# Patient Record
Sex: Female | Born: 1945 | Race: White | Hispanic: No | Marital: Married | Smoking: Never smoker
Health system: Southern US, Community
[De-identification: ages and names within clinical notes are randomized; demographics above are authoritative.]

## PROBLEM LIST (undated history)

## (undated) DIAGNOSIS — M722 Plantar fascial fibromatosis: Secondary | ICD-10-CM

## (undated) HISTORY — DX: Plantar fascial fibromatosis: M72.2

---

## 2012-02-16 ENCOUNTER — Other Ambulatory Visit: Payer: Self-pay | Admitting: Family Medicine

## 2012-02-16 DIAGNOSIS — Z780 Asymptomatic menopausal state: Secondary | ICD-10-CM

## 2012-02-16 DIAGNOSIS — Z1231 Encounter for screening mammogram for malignant neoplasm of breast: Secondary | ICD-10-CM

## 2012-03-25 ENCOUNTER — Ambulatory Visit
Admission: RE | Admit: 2012-03-25 | Discharge: 2012-03-25 | Disposition: A | Discharge status: Home / Self Care | Payer: Medicare Other | Source: Ambulatory Visit | Attending: Family Medicine | Admitting: Family Medicine

## 2012-03-25 DIAGNOSIS — Z780 Asymptomatic menopausal state: Secondary | ICD-10-CM

## 2012-03-25 DIAGNOSIS — Z1231 Encounter for screening mammogram for malignant neoplasm of breast: Secondary | ICD-10-CM

## 2013-02-22 ENCOUNTER — Other Ambulatory Visit: Payer: Self-pay

## 2013-02-22 DIAGNOSIS — Z1231 Encounter for screening mammogram for malignant neoplasm of breast: Secondary | ICD-10-CM

## 2013-03-28 ENCOUNTER — Ambulatory Visit: Payer: Medicare Other

## 2013-03-31 ENCOUNTER — Ambulatory Visit
Admission: RE | Admit: 2013-03-31 | Discharge: 2013-03-31 | Disposition: A | Discharge status: Home / Self Care | Payer: Medicare Other | Source: Ambulatory Visit

## 2013-03-31 DIAGNOSIS — Z1231 Encounter for screening mammogram for malignant neoplasm of breast: Secondary | ICD-10-CM

## 2013-04-05 ENCOUNTER — Ambulatory Visit: Payer: Medicare Other

## 2014-03-13 ENCOUNTER — Other Ambulatory Visit: Payer: Self-pay | Admitting: Obstetrics and Gynecology

## 2014-03-13 DIAGNOSIS — M810 Age-related osteoporosis without current pathological fracture: Secondary | ICD-10-CM

## 2014-03-13 DIAGNOSIS — Z1231 Encounter for screening mammogram for malignant neoplasm of breast: Secondary | ICD-10-CM

## 2014-04-03 ENCOUNTER — Other Ambulatory Visit: Payer: Medicare Other

## 2014-04-03 ENCOUNTER — Ambulatory Visit: Payer: Medicare Other

## 2014-04-05 ENCOUNTER — Ambulatory Visit
Admission: RE | Admit: 2014-04-05 | Discharge: 2014-04-05 | Disposition: A | Discharge status: Home / Self Care | Payer: Medicare Other | Source: Ambulatory Visit | Attending: Obstetrics and Gynecology | Admitting: Obstetrics and Gynecology

## 2014-04-05 DIAGNOSIS — M810 Age-related osteoporosis without current pathological fracture: Secondary | ICD-10-CM

## 2014-04-05 DIAGNOSIS — Z1231 Encounter for screening mammogram for malignant neoplasm of breast: Secondary | ICD-10-CM

## 2015-03-06 ENCOUNTER — Other Ambulatory Visit: Payer: Self-pay

## 2015-03-06 DIAGNOSIS — Z1231 Encounter for screening mammogram for malignant neoplasm of breast: Secondary | ICD-10-CM

## 2015-04-09 ENCOUNTER — Ambulatory Visit
Admission: RE | Admit: 2015-04-09 | Discharge: 2015-04-09 | Disposition: A | Discharge status: Home / Self Care | Payer: Medicare Other | Source: Ambulatory Visit

## 2015-04-09 DIAGNOSIS — Z1231 Encounter for screening mammogram for malignant neoplasm of breast: Secondary | ICD-10-CM

## 2016-03-10 ENCOUNTER — Other Ambulatory Visit: Payer: Self-pay | Admitting: Obstetrics and Gynecology

## 2016-03-10 DIAGNOSIS — Z1231 Encounter for screening mammogram for malignant neoplasm of breast: Secondary | ICD-10-CM

## 2016-03-24 ENCOUNTER — Ambulatory Visit (INDEPENDENT_AMBULATORY_CARE_PROVIDER_SITE_OTHER): Payer: Medicare Other

## 2016-03-24 ENCOUNTER — Ambulatory Visit (INDEPENDENT_AMBULATORY_CARE_PROVIDER_SITE_OTHER): Payer: Medicare Other | Admitting: Podiatry

## 2016-03-24 ENCOUNTER — Encounter: Payer: Self-pay | Admitting: Podiatry

## 2016-03-24 ENCOUNTER — Ambulatory Visit: Payer: Medicare Other | Admitting: Podiatry

## 2016-03-24 VITALS — BP 112/75 | HR 77 | Resp 16 | Ht 61.0 in | Wt 115.0 lb

## 2016-03-24 DIAGNOSIS — M205X1 Other deformities of toe(s) (acquired), right foot: Secondary | ICD-10-CM

## 2016-03-24 DIAGNOSIS — M722 Plantar fascial fibromatosis: Secondary | ICD-10-CM

## 2016-03-24 DIAGNOSIS — M79671 Pain in right foot: Secondary | ICD-10-CM

## 2016-03-24 DIAGNOSIS — M779 Enthesopathy, unspecified: Secondary | ICD-10-CM | POA: Diagnosis not present

## 2016-03-24 MED ORDER — TRIAMCINOLONE ACETONIDE 10 MG/ML IJ SUSP
10.0000 mg | Freq: Once | INTRAMUSCULAR | Status: AC
  Administered 2016-03-24: 10 mg

## 2016-03-24 NOTE — Progress Notes (Signed)
   Subjective:    Patient ID: Alicia Santana, female    DOB: May 06, 1946, 70 y.o.   MRN: [id number]  HPI Chief Complaint  Patient presents with  . Foot Pain    Right foot; dorsal (below all toes); swelling; pt stated, "Has had pain for past 2 weeks after went walking"      Review of Systems  All other systems reviewed and are negative.      Objective:   Physical Exam        Assessment & Plan:

## 2016-03-26 NOTE — Progress Notes (Signed)
Subjective:     Patient ID: Alicia Santana, female   DOB: [birth year], 70 y.o.   MRN: [id number]  HPI patient states she started to develop a lot of pain in her right forefoot over the last few weeks with increased activity. States she also has had a history of plantar fasciitis and does have some changes in her big toe joint   Review of Systems  All other systems reviewed and are negative.      Objective:   Physical Exam  Constitutional: She is oriented to person, place, and time.  Cardiovascular: Intact distal pulses.   Musculoskeletal: Normal range of motion.  Neurological: She is oriented to person, place, and time.  Skin: Skin is warm.  Nursing note and vitals reviewed.  neurovascular status intact muscle strength was adequate range of motion within normal limits with patient found to have discomfort in the right forefoot around the second MPJ with fluid buildup of an acute nature mild changes in the big toe joint right and also discomfort in the plantar heel right. 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 I did a proximal nerve block I then aspirated the second MPJ getting out of small amount of clear fluid and I then injected quarter cc of dexamethasone Kenalog and applied thick pad to reduce pressure on the joint. Advised this patient on not going barefoot and wearing supportive shoes and reappoint to recheck in 2 weeks  X-ray report indicates that there is no signs of fracture around the second metatarsal with modest changes around the first MPJ and depression of the arch noted

## 2016-04-17 ENCOUNTER — Ambulatory Visit
Admission: RE | Admit: 2016-04-17 | Discharge: 2016-04-17 | Disposition: A | Discharge status: Home / Self Care | Payer: Medicare Other | Source: Ambulatory Visit | Attending: Obstetrics and Gynecology | Admitting: Obstetrics and Gynecology

## 2016-04-17 DIAGNOSIS — Z1231 Encounter for screening mammogram for malignant neoplasm of breast: Secondary | ICD-10-CM

## 2016-05-16 ENCOUNTER — Other Ambulatory Visit: Payer: Self-pay | Admitting: Obstetrics and Gynecology

## 2016-05-16 DIAGNOSIS — M810 Age-related osteoporosis without current pathological fracture: Secondary | ICD-10-CM

## 2016-05-30 ENCOUNTER — Ambulatory Visit
Admission: RE | Admit: 2016-05-30 | Discharge: 2016-05-30 | Disposition: A | Discharge status: Home / Self Care | Payer: Medicare Other | Source: Ambulatory Visit | Attending: Obstetrics and Gynecology | Admitting: Obstetrics and Gynecology

## 2016-05-30 DIAGNOSIS — M810 Age-related osteoporosis without current pathological fracture: Secondary | ICD-10-CM

## 2017-03-09 ENCOUNTER — Other Ambulatory Visit: Payer: Self-pay | Admitting: Obstetrics and Gynecology

## 2017-03-09 ENCOUNTER — Ambulatory Visit (INDEPENDENT_AMBULATORY_CARE_PROVIDER_SITE_OTHER): Payer: Medicare Other | Admitting: Podiatry

## 2017-03-09 ENCOUNTER — Encounter: Payer: Self-pay | Admitting: Podiatry

## 2017-03-09 DIAGNOSIS — M722 Plantar fascial fibromatosis: Secondary | ICD-10-CM | POA: Diagnosis not present

## 2017-03-09 DIAGNOSIS — Z1231 Encounter for screening mammogram for malignant neoplasm of breast: Secondary | ICD-10-CM

## 2017-03-09 MED ORDER — TRIAMCINOLONE ACETONIDE 10 MG/ML IJ SUSP
10.0000 mg | Freq: Once | INTRAMUSCULAR | Status: AC
  Administered 2017-03-09: 10 mg

## 2017-04-28 ENCOUNTER — Ambulatory Visit
Admission: RE | Admit: 2017-04-28 | Discharge: 2017-04-28 | Disposition: A | Discharge status: Home / Self Care | Payer: Medicare Other | Source: Ambulatory Visit | Attending: Family Medicine | Admitting: Family Medicine

## 2017-04-28 ENCOUNTER — Other Ambulatory Visit: Payer: Self-pay | Admitting: Family Medicine

## 2017-04-28 DIAGNOSIS — R52 Pain, unspecified: Secondary | ICD-10-CM

## 2017-05-01 ENCOUNTER — Ambulatory Visit
Admission: RE | Admit: 2017-05-01 | Discharge: 2017-05-01 | Disposition: A | Discharge status: Home / Self Care | Payer: Medicare Other | Source: Ambulatory Visit | Attending: Obstetrics and Gynecology | Admitting: Obstetrics and Gynecology

## 2017-05-01 DIAGNOSIS — Z1231 Encounter for screening mammogram for malignant neoplasm of breast: Secondary | ICD-10-CM

## 2017-10-05 ENCOUNTER — Ambulatory Visit: Payer: Medicare Other | Admitting: Podiatry

## 2017-10-05 ENCOUNTER — Ambulatory Visit (INDEPENDENT_AMBULATORY_CARE_PROVIDER_SITE_OTHER): Payer: Medicare Other

## 2017-10-05 ENCOUNTER — Other Ambulatory Visit: Payer: Self-pay | Admitting: Podiatry

## 2017-10-05 ENCOUNTER — Encounter: Payer: Self-pay | Admitting: Podiatry

## 2017-10-05 DIAGNOSIS — M722 Plantar fascial fibromatosis: Secondary | ICD-10-CM | POA: Diagnosis not present

## 2017-10-05 DIAGNOSIS — M205X1 Other deformities of toe(s) (acquired), right foot: Secondary | ICD-10-CM | POA: Diagnosis not present

## 2017-10-05 DIAGNOSIS — M79671 Pain in right foot: Secondary | ICD-10-CM

## 2017-10-05 MED ORDER — TRIAMCINOLONE ACETONIDE 10 MG/ML IJ SUSP
10.0000 mg | Freq: Once | INTRAMUSCULAR | Status: AC
Start: 2017-10-05 — End: 2017-10-05
  Administered 2017-10-05: 10 mg

## 2017-10-07 NOTE — Progress Notes (Signed)
Subjective:   Patient ID: Alicia Santana, female   DOB: 72 y.o.   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 that she is concerned about   ROS      Objective:  Physical Exam  Neurovascular status intact with inflammation pain of the plantar heel right with fluid buildup and is noted to have moderate changes around the big toe joint with reduced range of motion of the first MPJ     Assessment:  Inflammatory fasciitis along with mild to moderate hallux limitus condition     Plan:  H&P condition reviewed and injected the plantar fascia right 3 mg Kenalog 5 mg Xylocaine and advised on the big toe joint and the consideration will long-term for treatment which may include fusion joint implantation or possible reconstruction  X-rays indicate hallux limitus deformity with spur narrowing of the joint surface and plantar spur formation

## 2018-04-01 ENCOUNTER — Other Ambulatory Visit: Payer: Self-pay | Admitting: Obstetrics and Gynecology

## 2018-04-01 DIAGNOSIS — Z1231 Encounter for screening mammogram for malignant neoplasm of breast: Secondary | ICD-10-CM

## 2018-04-08 ENCOUNTER — Encounter: Payer: Self-pay | Admitting: Podiatry

## 2018-04-08 ENCOUNTER — Ambulatory Visit: Payer: Medicare Other | Admitting: Podiatry

## 2018-04-08 DIAGNOSIS — M779 Enthesopathy, unspecified: Secondary | ICD-10-CM

## 2018-04-08 DIAGNOSIS — M205X1 Other deformities of toe(s) (acquired), right foot: Secondary | ICD-10-CM

## 2018-04-08 DIAGNOSIS — M722 Plantar fascial fibromatosis: Secondary | ICD-10-CM | POA: Diagnosis not present

## 2018-04-08 MED ORDER — TRIAMCINOLONE ACETONIDE 10 MG/ML IJ SUSP
10.0000 mg | Freq: Once | INTRAMUSCULAR | Status: AC
  Administered 2018-04-08: 10 mg

## 2018-04-11 NOTE — Progress Notes (Signed)
Subjective:   Patient ID: Alicia Santana, female   DOB: 72 y.o.   MRN: [id number]   HPI Patient states that she made a mistake in a more the wrong shoes and is developed a lot of pain on top of her right foot and states it hard to wear shoe gear currently.  Patient does not remember any specific injury.  Patient also complains of occasional pain in the big toe joint right   ROS      Objective:  Physical Exam  Neurovascular status intact with patient found to have inflammation pain in the sinus tarsi right with fluid buildup and also was noted to have moderate reduction of motion first MPJ right with mild discomfort with palpation     Assessment:  Inflammatory sinus tarsitis right with inflammation along with mild hallux limitus deformity right     Plan:  H&P discussed both conditions and today sinus tarsi injection administered right into the capsule 3 mg Kenalog 5 mg Xylocaine advised on reduced activity discussed hallux limitus deformity and considerations for treatment in the future but at this point we will try rigid bottom shoes.  Reappoint as symptoms indicate  X-rays indicate that there is no indications of arthritic or stress fracture process with moderate degenerative changes around the first MPJ right

## 2018-05-12 ENCOUNTER — Ambulatory Visit
Admission: RE | Admit: 2018-05-12 | Discharge: 2018-05-12 | Disposition: A | Discharge status: Home / Self Care | Payer: Medicare Other | Source: Ambulatory Visit | Attending: Obstetrics and Gynecology | Admitting: Obstetrics and Gynecology

## 2018-05-12 DIAGNOSIS — Z1231 Encounter for screening mammogram for malignant neoplasm of breast: Secondary | ICD-10-CM

## 2019-04-06 ENCOUNTER — Other Ambulatory Visit: Payer: Self-pay | Admitting: Obstetrics and Gynecology

## 2019-04-06 DIAGNOSIS — Z1231 Encounter for screening mammogram for malignant neoplasm of breast: Secondary | ICD-10-CM

## 2019-05-04 ENCOUNTER — Other Ambulatory Visit: Payer: Self-pay | Admitting: Family Medicine

## 2019-05-04 DIAGNOSIS — M810 Age-related osteoporosis without current pathological fracture: Secondary | ICD-10-CM

## 2019-05-31 ENCOUNTER — Other Ambulatory Visit: Payer: Self-pay

## 2019-05-31 ENCOUNTER — Ambulatory Visit
Admission: RE | Admit: 2019-05-31 | Discharge: 2019-05-31 | Disposition: A | Discharge status: Home / Self Care | Payer: Medicare Other | Source: Ambulatory Visit | Attending: Obstetrics and Gynecology | Admitting: Obstetrics and Gynecology

## 2019-05-31 DIAGNOSIS — Z1231 Encounter for screening mammogram for malignant neoplasm of breast: Secondary | ICD-10-CM

## 2019-06-23 ENCOUNTER — Ambulatory Visit: Payer: Medicare Other | Attending: Internal Medicine

## 2019-06-23 ENCOUNTER — Other Ambulatory Visit: Payer: Self-pay

## 2019-06-23 DIAGNOSIS — Z23 Encounter for immunization: Secondary | ICD-10-CM | POA: Insufficient documentation

## 2019-06-23 NOTE — Progress Notes (Signed)
   Covid-19 Vaccination Clinic  Name:  Alicia Santana    MRN: [id number] DOB: 1946/01/05  06/23/2019  [name] was observed post Covid-19 immunization for 15 minutes without incidence. She was provided with Vaccine Information Sheet and instruction to access the V-Safe system.   [name] was instructed to call 911 with any severe reactions post vaccine: Marland Kitchen Difficulty breathing  . Swelling of your face and throat  . A fast heartbeat  . A bad rash all over your body  . Dizziness and weakness    Immunizations Administered    Name Date Dose VIS Date Route   Pfizer COVID-19 Vaccine 06/23/2019  5:30 PM 0.3 mL 05/13/2019 Intramuscular   Manufacturer: [place]   Lot: [id number]   [place]: [id number]

## 2019-07-14 ENCOUNTER — Ambulatory Visit: Payer: Medicare Other | Attending: Internal Medicine

## 2019-07-14 DIAGNOSIS — Z23 Encounter for immunization: Secondary | ICD-10-CM | POA: Insufficient documentation

## 2019-07-14 NOTE — Progress Notes (Signed)
   Covid-19 Vaccination Clinic  Name:  Alicia Santana    MRN: [id number] DOB: 30-Sep-1945  07/14/2019  [name] was observed post Covid-19 immunization for 15 minutes without incidence. She was provided with Vaccine Information Sheet and instruction to access the V-Safe system.   [name] was instructed to call 911 with any severe reactions post vaccine: Marland Kitchen Difficulty breathing  . Swelling of your face and throat  . A fast heartbeat  . A bad rash all over your body  . Dizziness and weakness    Immunizations Administered    Name Date Dose VIS Date Route   Pfizer COVID-19 Vaccine 07/14/2019  8:44 AM 0.3 mL 05/13/2019 Intramuscular   Manufacturer: [place]   Lot: [id number]   [place]: [id number]

## 2019-07-19 ENCOUNTER — Other Ambulatory Visit: Payer: Self-pay

## 2019-07-19 ENCOUNTER — Ambulatory Visit
Admission: RE | Admit: 2019-07-19 | Discharge: 2019-07-19 | Disposition: A | Discharge status: Home / Self Care | Payer: Medicare Other | Source: Ambulatory Visit | Attending: Family Medicine | Admitting: Family Medicine

## 2019-07-19 DIAGNOSIS — M810 Age-related osteoporosis without current pathological fracture: Secondary | ICD-10-CM

## 2020-03-19 ENCOUNTER — Ambulatory Visit: Payer: Medicare Other | Admitting: Podiatry

## 2020-03-19 ENCOUNTER — Ambulatory Visit (INDEPENDENT_AMBULATORY_CARE_PROVIDER_SITE_OTHER): Payer: Medicare Other

## 2020-03-19 ENCOUNTER — Encounter: Payer: Self-pay | Admitting: Podiatry

## 2020-03-19 ENCOUNTER — Other Ambulatory Visit: Payer: Self-pay

## 2020-03-19 DIAGNOSIS — M722 Plantar fascial fibromatosis: Secondary | ICD-10-CM | POA: Diagnosis not present

## 2020-03-19 DIAGNOSIS — L84 Corns and callosities: Secondary | ICD-10-CM

## 2020-03-20 NOTE — Progress Notes (Signed)
Subjective:   Patient ID: Alicia Santana, female   DOB: 74 y.o.   MRN: [id number]   HPI Patient presents stating she is had a lot of pain in the bottom of her right heel that occurred recently and also has a lesion left that is painful and makes walking difficult.  Patient states this is been ongoing and that its been a chronic problem for her   ROS      Objective:  Physical Exam  Neurovascular status intact with exquisite discomfort plantar heel right and lesion left that is painful when palpated plantarflexed fifth metatarsal     Assessment:  Plantar fasciitis right with a lesion left secondary to foot bone structure     Plan:  H&P x-ray right reviewed explained condition sterile prep done injected the plantar fascial right 3 mg Kenalog 5 mg liken applied sterile dressing and for the left debrided lesion no iatrogenic bleeding applied padding and explained on cushioned shoes.  Reappoint to recheck  X-rays indicate small spur no indication stress fracture arthritis

## 2020-05-01 ENCOUNTER — Other Ambulatory Visit: Payer: Self-pay | Admitting: Obstetrics and Gynecology

## 2020-05-01 DIAGNOSIS — Z1231 Encounter for screening mammogram for malignant neoplasm of breast: Secondary | ICD-10-CM

## 2020-06-19 ENCOUNTER — Ambulatory Visit: Payer: Medicare Other

## 2020-07-31 ENCOUNTER — Ambulatory Visit: Payer: Medicare Other

## 2020-07-31 ENCOUNTER — Other Ambulatory Visit: Payer: Self-pay

## 2020-07-31 ENCOUNTER — Ambulatory Visit
Admission: RE | Admit: 2020-07-31 | Discharge: 2020-07-31 | Disposition: A | Discharge status: Home / Self Care | Payer: Medicare Other | Source: Ambulatory Visit | Attending: Obstetrics and Gynecology | Admitting: Obstetrics and Gynecology

## 2020-07-31 DIAGNOSIS — Z1231 Encounter for screening mammogram for malignant neoplasm of breast: Secondary | ICD-10-CM

## 2020-11-03 DIAGNOSIS — R0789 Other chest pain: Secondary | ICD-10-CM | POA: Diagnosis not present

## 2020-11-03 DIAGNOSIS — R9431 Abnormal electrocardiogram [ECG] [EKG]: Secondary | ICD-10-CM | POA: Diagnosis not present

## 2020-11-04 DIAGNOSIS — R9431 Abnormal electrocardiogram [ECG] [EKG]: Secondary | ICD-10-CM | POA: Diagnosis not present

## 2020-11-15 DIAGNOSIS — R0789 Other chest pain: Secondary | ICD-10-CM | POA: Diagnosis not present

## 2021-01-25 DIAGNOSIS — H2513 Age-related nuclear cataract, bilateral: Secondary | ICD-10-CM | POA: Diagnosis not present

## 2021-01-25 DIAGNOSIS — H53002 Unspecified amblyopia, left eye: Secondary | ICD-10-CM | POA: Diagnosis not present

## 2021-01-25 DIAGNOSIS — H52203 Unspecified astigmatism, bilateral: Secondary | ICD-10-CM | POA: Diagnosis not present

## 2021-03-07 DIAGNOSIS — H15102 Unspecified episcleritis, left eye: Secondary | ICD-10-CM | POA: Diagnosis not present

## 2021-03-22 DIAGNOSIS — Z0000 Encounter for general adult medical examination without abnormal findings: Secondary | ICD-10-CM | POA: Diagnosis not present

## 2021-03-22 DIAGNOSIS — E7800 Pure hypercholesterolemia, unspecified: Secondary | ICD-10-CM | POA: Diagnosis not present

## 2021-03-22 DIAGNOSIS — E559 Vitamin D deficiency, unspecified: Secondary | ICD-10-CM | POA: Diagnosis not present

## 2021-03-22 DIAGNOSIS — M810 Age-related osteoporosis without current pathological fracture: Secondary | ICD-10-CM | POA: Diagnosis not present

## 2021-05-30 DIAGNOSIS — M810 Age-related osteoporosis without current pathological fracture: Secondary | ICD-10-CM | POA: Diagnosis not present

## 2021-05-30 DIAGNOSIS — E7800 Pure hypercholesterolemia, unspecified: Secondary | ICD-10-CM | POA: Diagnosis not present

## 2021-06-09 DIAGNOSIS — M25562 Pain in left knee: Secondary | ICD-10-CM | POA: Diagnosis not present

## 2021-06-11 DIAGNOSIS — M1712 Unilateral primary osteoarthritis, left knee: Secondary | ICD-10-CM | POA: Diagnosis not present

## 2021-06-11 DIAGNOSIS — M25462 Effusion, left knee: Secondary | ICD-10-CM | POA: Diagnosis not present

## 2021-06-11 DIAGNOSIS — M25562 Pain in left knee: Secondary | ICD-10-CM | POA: Diagnosis not present

## 2021-07-04 ENCOUNTER — Other Ambulatory Visit: Payer: Self-pay | Admitting: Obstetrics and Gynecology

## 2021-07-04 DIAGNOSIS — Z1231 Encounter for screening mammogram for malignant neoplasm of breast: Secondary | ICD-10-CM

## 2021-07-23 DIAGNOSIS — M7052 Other bursitis of knee, left knee: Secondary | ICD-10-CM | POA: Diagnosis not present

## 2021-08-01 ENCOUNTER — Ambulatory Visit
Admission: RE | Admit: 2021-08-01 | Discharge: 2021-08-01 | Disposition: A | Discharge status: Home / Self Care | Payer: Medicare Other | Source: Ambulatory Visit | Attending: Obstetrics and Gynecology | Admitting: Obstetrics and Gynecology

## 2021-08-01 DIAGNOSIS — Z1231 Encounter for screening mammogram for malignant neoplasm of breast: Secondary | ICD-10-CM

## 2021-08-06 DIAGNOSIS — M7052 Other bursitis of knee, left knee: Secondary | ICD-10-CM | POA: Diagnosis not present

## 2021-10-17 DIAGNOSIS — M25562 Pain in left knee: Secondary | ICD-10-CM | POA: Diagnosis not present

## 2022-01-29 DIAGNOSIS — H52203 Unspecified astigmatism, bilateral: Secondary | ICD-10-CM | POA: Diagnosis not present

## 2022-01-29 DIAGNOSIS — H53002 Unspecified amblyopia, left eye: Secondary | ICD-10-CM | POA: Diagnosis not present

## 2022-01-29 DIAGNOSIS — H2513 Age-related nuclear cataract, bilateral: Secondary | ICD-10-CM | POA: Diagnosis not present

## 2022-02-17 ENCOUNTER — Ambulatory Visit (INDEPENDENT_AMBULATORY_CARE_PROVIDER_SITE_OTHER): Payer: Medicare Other

## 2022-02-17 ENCOUNTER — Ambulatory Visit: Payer: Medicare Other | Admitting: Podiatry

## 2022-02-17 ENCOUNTER — Encounter: Payer: Self-pay | Admitting: Podiatry

## 2022-02-17 DIAGNOSIS — M2042 Other hammer toe(s) (acquired), left foot: Secondary | ICD-10-CM

## 2022-02-17 DIAGNOSIS — L84 Corns and callosities: Secondary | ICD-10-CM

## 2022-02-17 DIAGNOSIS — D169 Benign neoplasm of bone and articular cartilage, unspecified: Secondary | ICD-10-CM

## 2022-02-19 NOTE — Progress Notes (Signed)
Subjective:   Patient ID: Alicia Santana, female   DOB: 75 y.o.   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  At this point debrided lesion see the results applied cushioning and instructed on wider shoes  X-rays do indicate rotation of the toe at the both the proximal distal joint digit 5 left

## 2022-04-23 ENCOUNTER — Other Ambulatory Visit: Payer: Self-pay | Admitting: Family Medicine

## 2022-04-23 DIAGNOSIS — E7800 Pure hypercholesterolemia, unspecified: Secondary | ICD-10-CM | POA: Diagnosis not present

## 2022-04-23 DIAGNOSIS — M810 Age-related osteoporosis without current pathological fracture: Secondary | ICD-10-CM

## 2022-04-23 DIAGNOSIS — E559 Vitamin D deficiency, unspecified: Secondary | ICD-10-CM | POA: Diagnosis not present

## 2022-04-23 DIAGNOSIS — Z0000 Encounter for general adult medical examination without abnormal findings: Secondary | ICD-10-CM | POA: Diagnosis not present

## 2022-05-27 DIAGNOSIS — Z1211 Encounter for screening for malignant neoplasm of colon: Secondary | ICD-10-CM | POA: Diagnosis not present

## 2022-05-27 DIAGNOSIS — E7800 Pure hypercholesterolemia, unspecified: Secondary | ICD-10-CM | POA: Diagnosis not present

## 2022-05-27 DIAGNOSIS — E559 Vitamin D deficiency, unspecified: Secondary | ICD-10-CM | POA: Diagnosis not present

## 2022-05-27 DIAGNOSIS — Z23 Encounter for immunization: Secondary | ICD-10-CM | POA: Diagnosis not present

## 2022-05-27 DIAGNOSIS — M810 Age-related osteoporosis without current pathological fracture: Secondary | ICD-10-CM | POA: Diagnosis not present

## 2022-06-19 DIAGNOSIS — E7800 Pure hypercholesterolemia, unspecified: Secondary | ICD-10-CM | POA: Diagnosis not present

## 2022-06-19 DIAGNOSIS — M810 Age-related osteoporosis without current pathological fracture: Secondary | ICD-10-CM | POA: Diagnosis not present

## 2022-06-30 ENCOUNTER — Other Ambulatory Visit: Payer: Self-pay | Admitting: Obstetrics and Gynecology

## 2022-06-30 DIAGNOSIS — Z1231 Encounter for screening mammogram for malignant neoplasm of breast: Secondary | ICD-10-CM

## 2022-08-26 ENCOUNTER — Ambulatory Visit
Admission: RE | Admit: 2022-08-26 | Discharge: 2022-08-26 | Disposition: A | Discharge status: Home / Self Care | Payer: Medicare Other | Source: Ambulatory Visit | Attending: Obstetrics and Gynecology | Admitting: Obstetrics and Gynecology

## 2022-08-26 DIAGNOSIS — Z1231 Encounter for screening mammogram for malignant neoplasm of breast: Secondary | ICD-10-CM

## 2022-10-05 IMAGING — MG MM DIGITAL SCREENING BILAT W/ TOMO AND CAD
8 series · 9 of 24 positions shown · non-contrast
Comparison: Previous exam(s).

CLINICAL DATA: Screening.

EXAM:
DIGITAL SCREENING BILATERAL MAMMOGRAM WITH TOMOSYNTHESIS AND CAD
TECHNIQUE: Bilateral screening digital craniocaudal and mediolateral oblique
mammograms were obtained. Bilateral screening digital breast
tomosynthesis was performed. The images were evaluated with
computer-aided detection.

[L CC synth-2D]
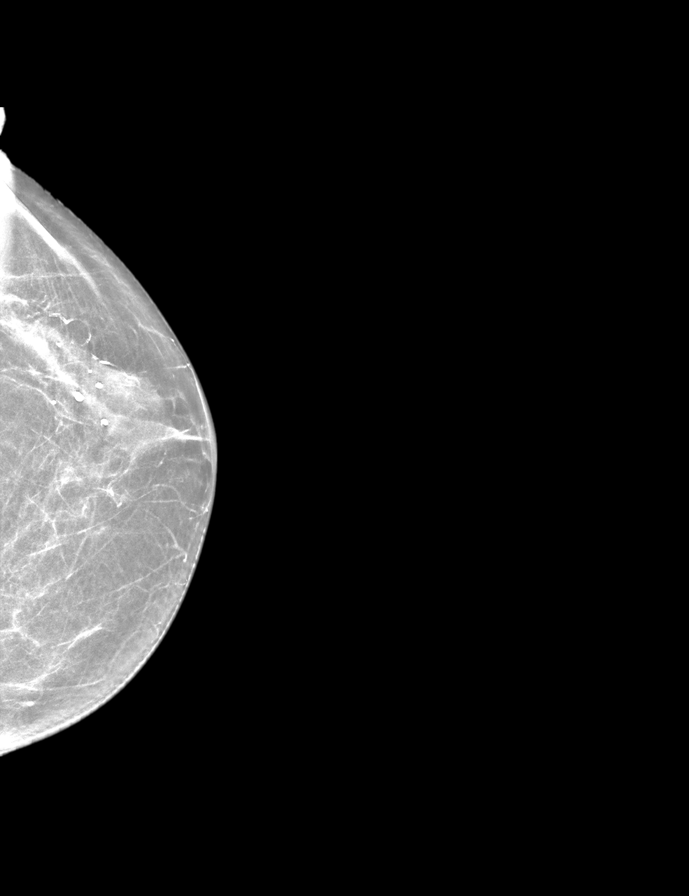

[R CC synth-2D]
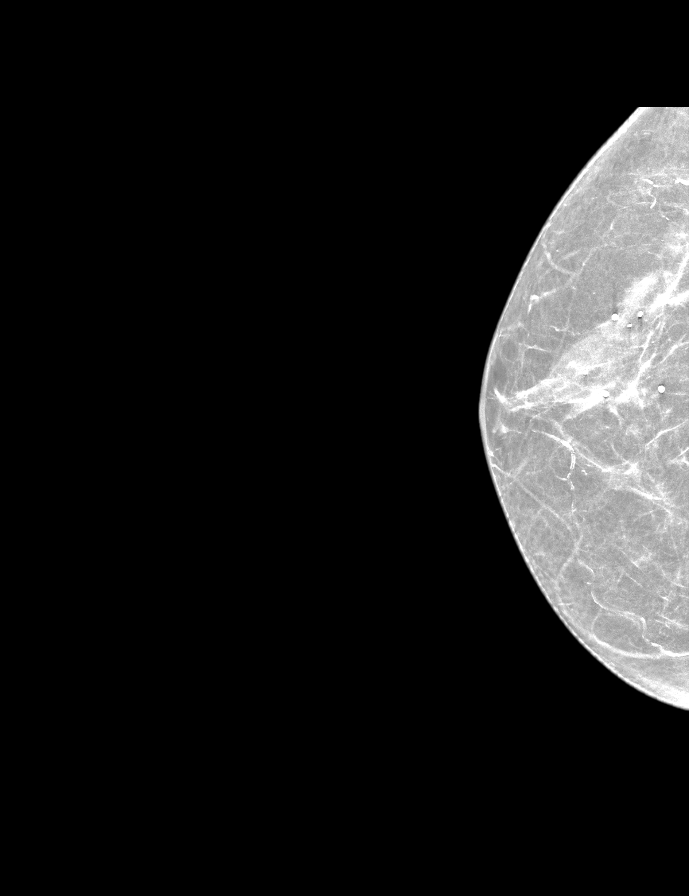

[L MLO synth-2D]
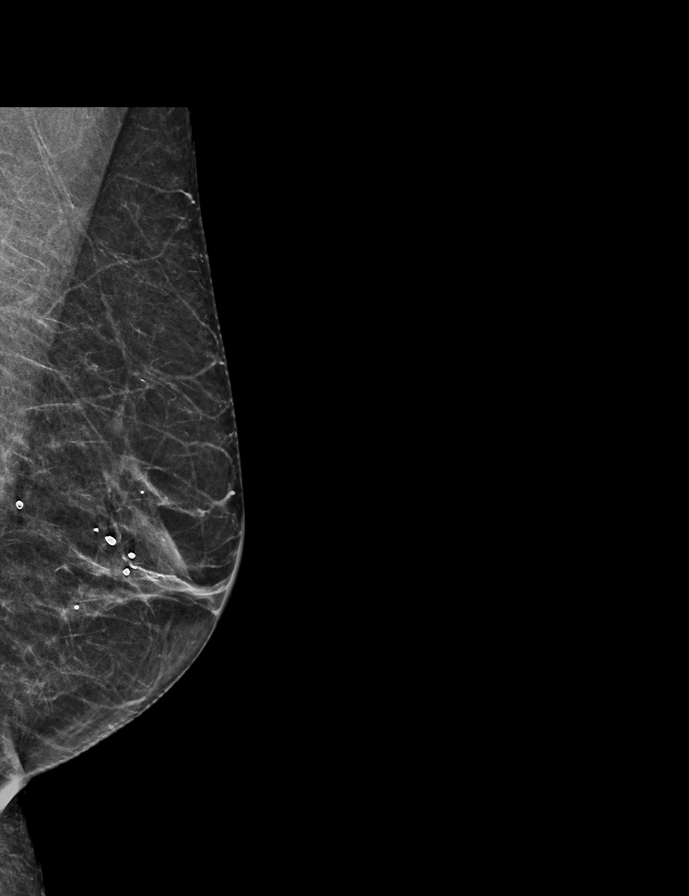

[R MLO synth-2D]
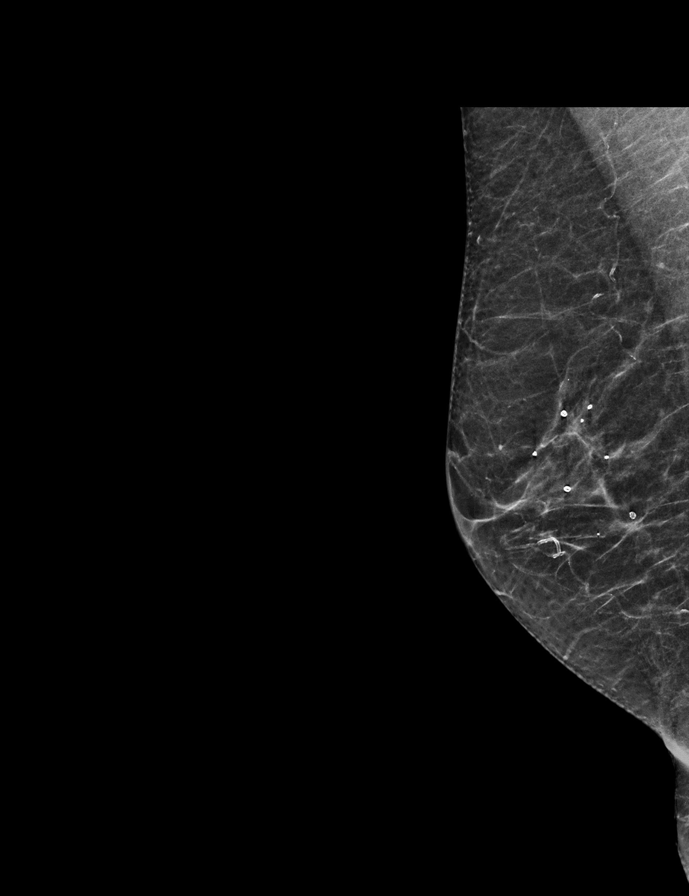

[L CC tomo · 2 of 56 frames shown]
[frame 19/56]
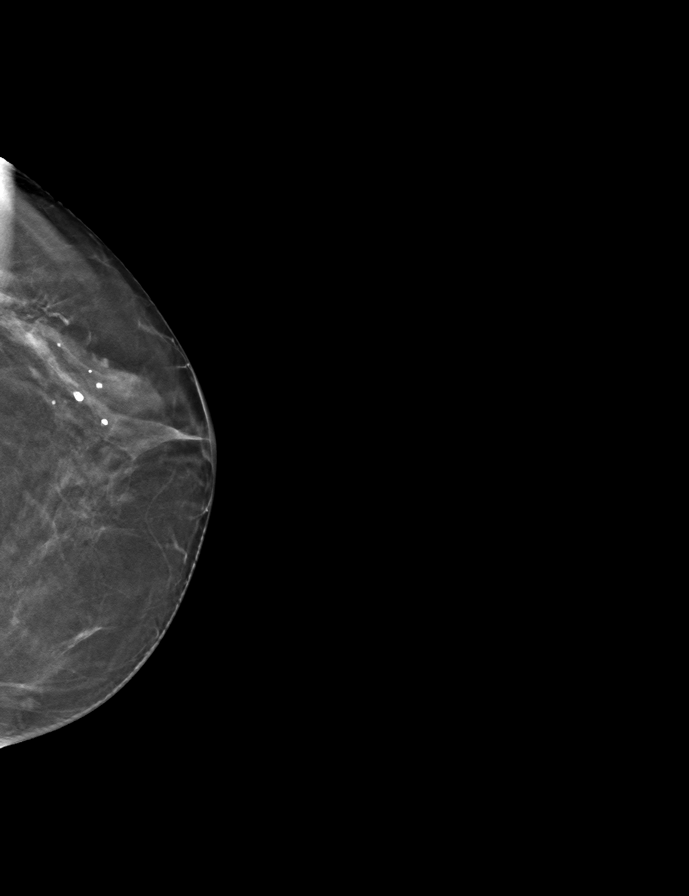
[frame 29/56]
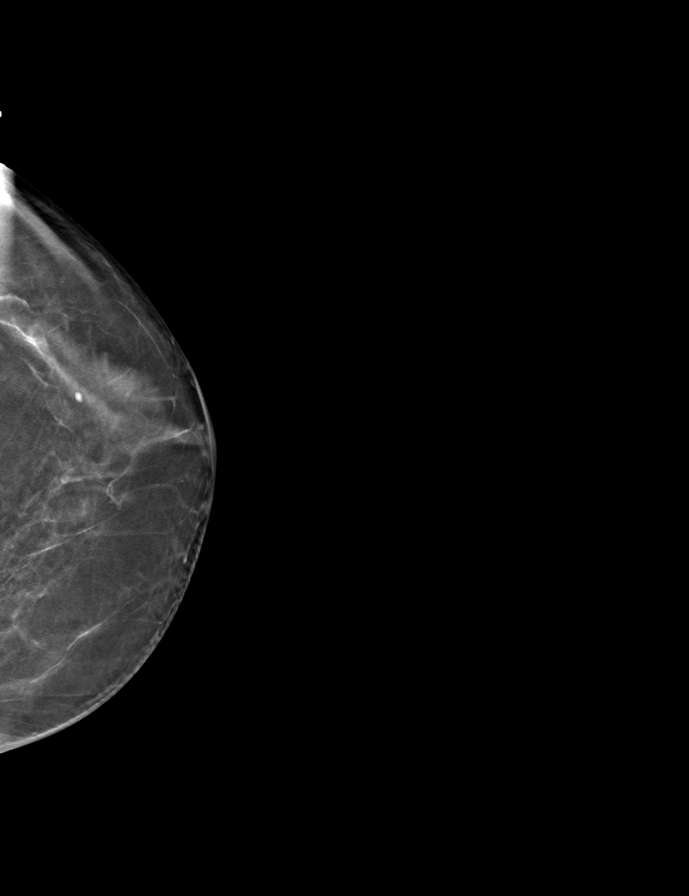

[R CC tomo · tomo slice 25/48.0]
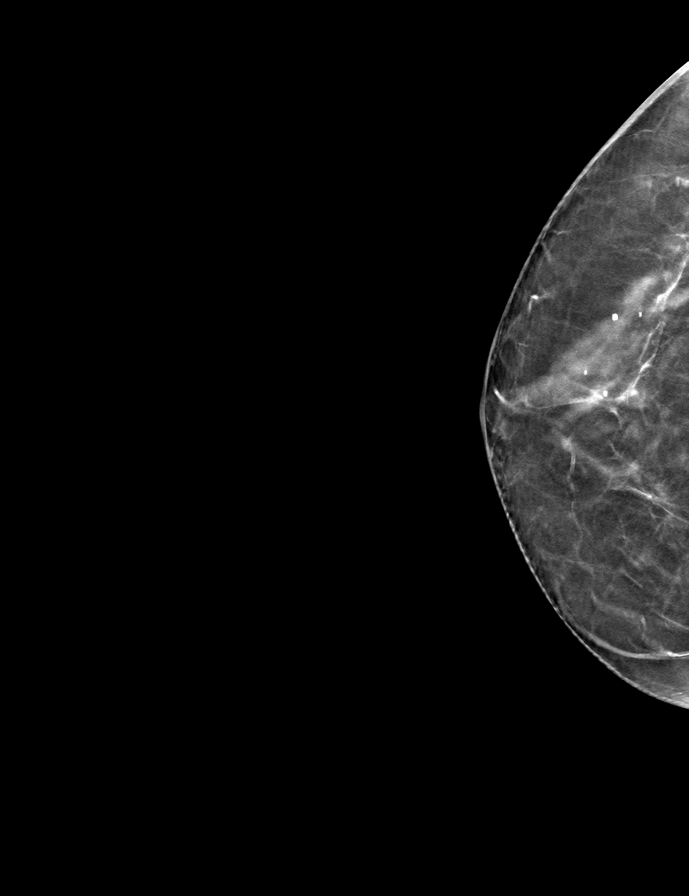

[R MLO tomo · tomo slice 25/48.0]
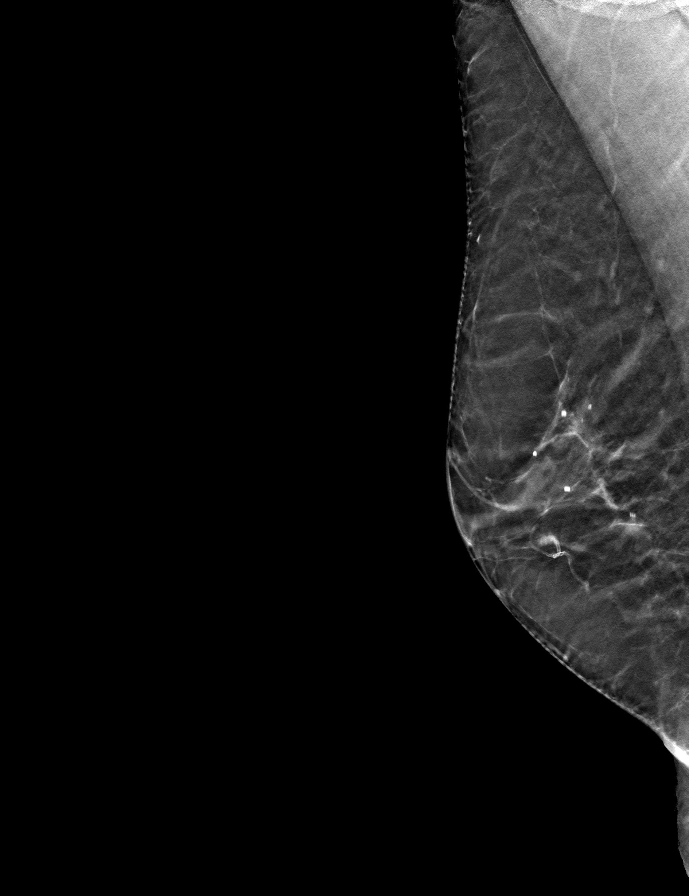

[L MLO tomo · tomo slice 25/50.0]
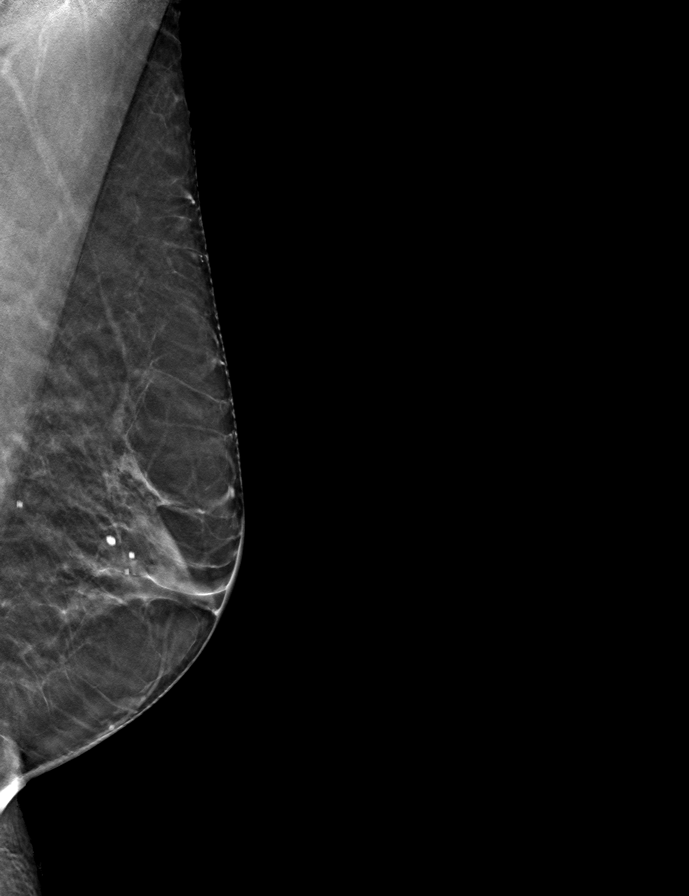

[9 of 24 positions shown; findings below may reference images not displayed]

ACR Breast Density Category b: There are scattered areas of
fibroglandular density.
FINDINGS: There are no findings suspicious for malignancy.
IMPRESSION: No mammographic evidence of malignancy. A result letter of this
screening mammogram will be mailed directly to the patient.

RECOMMENDATION:
Screening mammogram in one year. (Code:51-O-LD2)

BI-RADS CATEGORY  1: Negative.

## 2022-10-13 ENCOUNTER — Inpatient Hospital Stay: Admission: RE | Admit: 2022-10-13 | Payer: Medicare Other | Source: Ambulatory Visit

## 2022-10-13 ENCOUNTER — Other Ambulatory Visit: Payer: Self-pay | Admitting: Family Medicine

## 2022-10-13 DIAGNOSIS — M810 Age-related osteoporosis without current pathological fracture: Secondary | ICD-10-CM

## 2023-02-11 DIAGNOSIS — H5000 Unspecified esotropia: Secondary | ICD-10-CM | POA: Diagnosis not present

## 2023-02-11 DIAGNOSIS — H52203 Unspecified astigmatism, bilateral: Secondary | ICD-10-CM | POA: Diagnosis not present

## 2023-02-11 DIAGNOSIS — H2513 Age-related nuclear cataract, bilateral: Secondary | ICD-10-CM | POA: Diagnosis not present

## 2023-04-27 DIAGNOSIS — Z0000 Encounter for general adult medical examination without abnormal findings: Secondary | ICD-10-CM | POA: Diagnosis not present

## 2023-05-11 ENCOUNTER — Ambulatory Visit
Admission: RE | Admit: 2023-05-11 | Discharge: 2023-05-11 | Disposition: A | Discharge status: Home / Self Care | Payer: Medicare Other | Source: Ambulatory Visit | Attending: Family Medicine | Admitting: Family Medicine

## 2023-05-11 DIAGNOSIS — M810 Age-related osteoporosis without current pathological fracture: Secondary | ICD-10-CM

## 2023-05-11 DIAGNOSIS — M8588 Other specified disorders of bone density and structure, other site: Secondary | ICD-10-CM | POA: Diagnosis not present

## 2023-05-19 DIAGNOSIS — E559 Vitamin D deficiency, unspecified: Secondary | ICD-10-CM | POA: Diagnosis not present

## 2023-05-19 DIAGNOSIS — E7800 Pure hypercholesterolemia, unspecified: Secondary | ICD-10-CM | POA: Diagnosis not present

## 2023-05-19 DIAGNOSIS — Z1211 Encounter for screening for malignant neoplasm of colon: Secondary | ICD-10-CM | POA: Diagnosis not present

## 2023-05-19 DIAGNOSIS — M810 Age-related osteoporosis without current pathological fracture: Secondary | ICD-10-CM | POA: Diagnosis not present

## 2023-05-22 DIAGNOSIS — Z1211 Encounter for screening for malignant neoplasm of colon: Secondary | ICD-10-CM | POA: Diagnosis not present

## 2023-07-21 ENCOUNTER — Other Ambulatory Visit: Payer: Self-pay | Admitting: Family Medicine

## 2023-07-21 DIAGNOSIS — Z1231 Encounter for screening mammogram for malignant neoplasm of breast: Secondary | ICD-10-CM

## 2023-08-27 ENCOUNTER — Ambulatory Visit
Admission: RE | Admit: 2023-08-27 | Discharge: 2023-08-27 | Disposition: A | Discharge status: Home / Self Care | Payer: Medicare Other | Source: Ambulatory Visit | Attending: Family Medicine | Admitting: Family Medicine

## 2023-08-27 DIAGNOSIS — Z1231 Encounter for screening mammogram for malignant neoplasm of breast: Secondary | ICD-10-CM

## 2024-01-14 ENCOUNTER — Ambulatory Visit: Admitting: Podiatry

## 2024-01-14 ENCOUNTER — Ambulatory Visit (INDEPENDENT_AMBULATORY_CARE_PROVIDER_SITE_OTHER)

## 2024-01-14 ENCOUNTER — Encounter: Payer: Self-pay | Admitting: Podiatry

## 2024-01-14 DIAGNOSIS — M722 Plantar fascial fibromatosis: Secondary | ICD-10-CM

## 2024-01-14 MED ORDER — TRIAMCINOLONE ACETONIDE 10 MG/ML IJ SUSP
10.0000 mg | Freq: Once | INTRAMUSCULAR | Status: AC
  Administered 2024-01-14: 10 mg via INTRA_ARTICULAR

## 2024-01-15 NOTE — Progress Notes (Signed)
 Subjective:   Patient ID: Alicia Santana, female   DOB: 78 y.o.   MRN: [id number]   HPI Patient states she started to develop pain underneath the left heel that is progressively getting worse and she has a trip coming up and would like it treated   ROS      Objective:  Physical Exam  Neurovascular status intact with significant discomfort plantar aspect left heel at the insertional point tendon calcaneus     Assessment:  Acute plantar fasciitis left with inflammation fluid buildup     Plan:  H&P reviewed in today sterile prep and injected the fascia at insertion 3 mg dexamethasone Kenalog 5 mg Xylocaine applied sterile dressing reappoint as symptoms indicate  X-rays indicate there was spur no indication stress fracture arthritis

## 2024-02-10 DIAGNOSIS — Z23 Encounter for immunization: Secondary | ICD-10-CM | POA: Diagnosis not present

## 2024-02-16 DIAGNOSIS — H5000 Unspecified esotropia: Secondary | ICD-10-CM | POA: Diagnosis not present

## 2024-02-16 DIAGNOSIS — H52203 Unspecified astigmatism, bilateral: Secondary | ICD-10-CM | POA: Diagnosis not present

## 2024-02-16 DIAGNOSIS — H53002 Unspecified amblyopia, left eye: Secondary | ICD-10-CM | POA: Diagnosis not present

## 2024-02-16 DIAGNOSIS — H2513 Age-related nuclear cataract, bilateral: Secondary | ICD-10-CM | POA: Diagnosis not present

## 2024-05-13 ENCOUNTER — Encounter: Payer: Self-pay | Admitting: Podiatry

## 2024-05-13 ENCOUNTER — Ambulatory Visit: Admitting: Podiatry

## 2024-05-13 ENCOUNTER — Ambulatory Visit (INDEPENDENT_AMBULATORY_CARE_PROVIDER_SITE_OTHER)

## 2024-05-13 DIAGNOSIS — R600 Localized edema: Secondary | ICD-10-CM | POA: Diagnosis not present

## 2024-05-13 DIAGNOSIS — M84375A Stress fracture, left foot, initial encounter for fracture: Secondary | ICD-10-CM

## 2024-05-13 NOTE — Progress Notes (Signed)
 Subjective:   Patient ID: Alicia Santana, female   DOB: 78 y.o.   MRN: [id number]   HPI Patient states she has swelling on top of her foot and it started a few days ago and she was in [place] has been flying states that it has been mildly sore not severe   ROS      Objective:  Physical Exam  Neurovascular status found to be intact there is edema in the dorsum of the left foot negative Toula' sign was noted does not extend into the ankle nonpitting     Assessment:  Appears to be more of a localized issue even though I cannot rule out a subtle DVT or other pathology     Plan:  H&P x-rays reviewed discussed and at this point I did discuss any change in this as far as pain in the calf or any shortness of breath that I want her to go straight to the emergency room but I do think it is local and I do not see the reason to have any more aggressive treatment plan.  Patient agrees with this and is comfortable and at this point she had compression stocking dispensed and instructions on elevation  X-rays indicate that there is mild swelling dorsum of the left foot localized no pitting edema noted at the current time and no indication of stress fracture or fracture

## 2024-05-17 ENCOUNTER — Other Ambulatory Visit: Payer: Self-pay | Admitting: Physician Assistant

## 2024-05-17 DIAGNOSIS — R609 Edema, unspecified: Secondary | ICD-10-CM

## 2024-05-17 DIAGNOSIS — I809 Phlebitis and thrombophlebitis of unspecified site: Secondary | ICD-10-CM

## 2024-05-30 ENCOUNTER — Ambulatory Visit
Admission: RE | Admit: 2024-05-30 | Discharge: 2024-05-30 | Disposition: A | Source: Ambulatory Visit | Attending: Physician Assistant | Admitting: Physician Assistant

## 2024-05-30 DIAGNOSIS — R609 Edema, unspecified: Secondary | ICD-10-CM

## 2024-05-30 DIAGNOSIS — I809 Phlebitis and thrombophlebitis of unspecified site: Secondary | ICD-10-CM
